# Patient Record
Sex: Female | Born: 1950 | Race: White | Hispanic: No | Smoking: Never smoker
Health system: Southern US, Community
[De-identification: ages and names within clinical notes are randomized; demographics above are authoritative.]

## PROBLEM LIST (undated history)

## (undated) HISTORY — PX: FOOT SURGERY: SHX648

## (undated) HISTORY — PX: NECK SURGERY: SHX720

## (undated) HISTORY — PX: CHOLECYSTECTOMY: SHX55

---

## 2017-03-04 ENCOUNTER — Emergency Department (HOSPITAL_COMMUNITY): Payer: Commercial Managed Care - PPO

## 2017-03-04 ENCOUNTER — Emergency Department (HOSPITAL_COMMUNITY)
Admission: EM | Admit: 2017-03-04 | Discharge: 2017-03-04 | Disposition: A | Discharge status: Home / Self Care | Payer: Commercial Managed Care - PPO | Attending: Emergency Medicine | Admitting: Emergency Medicine

## 2017-03-04 ENCOUNTER — Encounter (HOSPITAL_COMMUNITY): Payer: Self-pay | Admitting: *Deleted

## 2017-03-04 DIAGNOSIS — Y92410 Unspecified street and highway as the place of occurrence of the external cause: Secondary | ICD-10-CM | POA: Insufficient documentation

## 2017-03-04 DIAGNOSIS — I609 Nontraumatic subarachnoid hemorrhage, unspecified: Secondary | ICD-10-CM | POA: Insufficient documentation

## 2017-03-04 DIAGNOSIS — Z23 Encounter for immunization: Secondary | ICD-10-CM | POA: Insufficient documentation

## 2017-03-04 DIAGNOSIS — S0101XA Laceration without foreign body of scalp, initial encounter: Secondary | ICD-10-CM | POA: Insufficient documentation

## 2017-03-04 DIAGNOSIS — Y999 Unspecified external cause status: Secondary | ICD-10-CM | POA: Insufficient documentation

## 2017-03-04 DIAGNOSIS — Z79899 Other long term (current) drug therapy: Secondary | ICD-10-CM | POA: Insufficient documentation

## 2017-03-04 DIAGNOSIS — S0000XA Unspecified superficial injury of scalp, initial encounter: Secondary | ICD-10-CM | POA: Diagnosis present

## 2017-03-04 DIAGNOSIS — Y9389 Activity, other specified: Secondary | ICD-10-CM | POA: Diagnosis not present

## 2017-03-04 DIAGNOSIS — I10 Essential (primary) hypertension: Secondary | ICD-10-CM | POA: Diagnosis not present

## 2017-03-04 LAB — COMPREHENSIVE METABOLIC PANEL
ALK PHOS: 71 U/L (ref 38–126)
ALT: 22 U/L (ref 14–54)
ANION GAP: 11 (ref 5–15)
AST: 25 U/L (ref 15–41)
Albumin: 4.1 g/dL (ref 3.5–5.0)
BUN: 15 mg/dL (ref 6–20)
CALCIUM: 10 mg/dL (ref 8.9–10.3)
CO2: 24 mmol/L (ref 22–32)
Chloride: 104 mmol/L (ref 101–111)
Creatinine, Ser: 0.77 mg/dL (ref 0.44–1.00)
Glucose, Bld: 114 mg/dL — ABNORMAL HIGH (ref 65–99)
Potassium: 3.7 mmol/L (ref 3.5–5.1)
SODIUM: 139 mmol/L (ref 135–145)
TOTAL PROTEIN: 7.2 g/dL (ref 6.5–8.1)
Total Bilirubin: 1.1 mg/dL (ref 0.3–1.2)

## 2017-03-04 LAB — CBC WITH DIFFERENTIAL/PLATELET
Basophils Absolute: 0 10*3/uL (ref 0.0–0.1)
Basophils Relative: 0 %
EOS ABS: 0.1 10*3/uL (ref 0.0–0.7)
Eosinophils Relative: 1 %
HCT: 41.7 % (ref 36.0–46.0)
HEMOGLOBIN: 14.4 g/dL (ref 12.0–15.0)
LYMPHS ABS: 1.2 10*3/uL (ref 0.7–4.0)
Lymphocytes Relative: 16 %
MCH: 32.2 pg (ref 26.0–34.0)
MCHC: 34.5 g/dL (ref 30.0–36.0)
MCV: 93.3 fL (ref 78.0–100.0)
MONOS PCT: 10 %
Monocytes Absolute: 0.7 10*3/uL (ref 0.1–1.0)
NEUTROS PCT: 73 %
Neutro Abs: 5.6 10*3/uL (ref 1.7–7.7)
Platelets: 266 10*3/uL (ref 150–400)
RBC: 4.47 MIL/uL (ref 3.87–5.11)
RDW: 12.9 % (ref 11.5–15.5)
WBC: 7.7 10*3/uL (ref 4.0–10.5)

## 2017-03-04 LAB — I-STAT TROPONIN, ED: TROPONIN I, POC: 0 ng/mL (ref 0.00–0.08)

## 2017-03-04 MED ORDER — TETANUS-DIPHTH-ACELL PERTUSSIS 5-2.5-18.5 LF-MCG/0.5 IM SUSP
0.5000 mL | Freq: Once | INTRAMUSCULAR | Status: AC
  Administered 2017-03-04: 0.5 mL via INTRAMUSCULAR
  Filled 2017-03-04: qty 0.5

## 2017-03-04 MED ORDER — HYDROCODONE-ACETAMINOPHEN 5-325 MG PO TABS: 1.0000 | ORAL_TABLET | Freq: Four times a day (QID) | ORAL | 0 refills | Status: AC | PRN

## 2017-03-04 NOTE — Progress Notes (Signed)
Orthopedic Tech Progress Note Patient Details:  Maria HarrierRebecca R Martinez 02/27/1951 [id number]  Ortho Devices Type of Ortho Device: Soft collar Ortho Device/Splint Location: neck Ortho Device/Splint Interventions: Application   Locke Barrell 03/04/2017, 3:03 PM

## 2017-03-04 NOTE — Discharge Instructions (Signed)
You have small bleeding in your brain. Dr. Danielle DessElsner from neurosurgery reviewed your images and mentioned that you don't need another CT. You should expect headaches and dizziness for several days.   Avoid motrin or aspirin as they may cause more bleeding.   Take tylenol as needed for headaches.   Take vicodin for severe pain. Do not drive with it.   See your doctor. Staple removal in a week   Return to ER if you have severe headaches, vomiting, lethargy, trouble walking.

## 2017-03-04 NOTE — ED Notes (Signed)
MD at bedside. 

## 2017-03-04 NOTE — Progress Notes (Signed)
Orthopedic Tech Progress Note Patient Details:  Maria HarrierRebecca R Martinez 09/02/1950 [id number]  Patient ID: Maria Harrierebecca R Bran, female   DOB: 09/14/1950, 66 y.o.   MRN: [id number]   Nikki DomCrawford, Cher Franzoni 03/04/2017, 3:05 PM Delete one cervical foam non adjustable and ortho visit charge

## 2017-03-04 NOTE — Progress Notes (Signed)
Orthopedic Tech Progress Note Patient Details:  Maria HarrierRebecca R Martinez 01/01/1951 [id number]  Ortho Devices Type of Ortho Device: Soft collar Ortho Device/Splint Location: neck Ortho Device/Splint Interventions: Application   Sophy Mesler 03/04/2017, 2:56 PM
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
## 2017-03-04 NOTE — ED Notes (Signed)
Patient verbalized understanding of discharge instructions and denies any further needs or questions at this time. VS stable. Patient ambulatory with steady gait. Escorted to ED entrance in wheelchair.   
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
